# Patient Record
Sex: Female | Born: 1980 | Race: White | Hispanic: No | Marital: Married
Health system: Midwestern US, Community
[De-identification: ages and names within clinical notes are randomized; demographics above are authoritative.]

## PROBLEM LIST (undated history)

## (undated) DIAGNOSIS — Q513 Bicornate uterus: Secondary | ICD-10-CM

## (undated) DIAGNOSIS — IMO0002 Reserved for concepts with insufficient information to code with codable children: Secondary | ICD-10-CM

## (undated) DIAGNOSIS — R87619 Unspecified abnormal cytological findings in specimens from cervix uteri: Secondary | ICD-10-CM

## (undated) DIAGNOSIS — R112 Nausea with vomiting, unspecified: Secondary | ICD-10-CM

## (undated) DIAGNOSIS — Z9889 Other specified postprocedural states: Secondary | ICD-10-CM

## (undated) DIAGNOSIS — K589 Irritable bowel syndrome without diarrhea: Secondary | ICD-10-CM

## (undated) HISTORY — PX: OTHER SURGICAL HISTORY: SHX169

## (undated) HISTORY — DX: Irritable bowel syndrome, unspecified: K58.9

---

## 2004-11-30 ENCOUNTER — Other Ambulatory Visit: Admission: RE | Admit: 2004-11-30 | Discharge: 2004-11-30 | Payer: Self-pay | Admitting: Gynecology

## 2005-12-04 ENCOUNTER — Other Ambulatory Visit: Admission: RE | Admit: 2005-12-04 | Discharge: 2005-12-04 | Payer: Self-pay | Admitting: Gynecology

## 2006-12-12 ENCOUNTER — Encounter: Admission: RE | Admit: 2006-12-12 | Discharge: 2006-12-12 | Payer: Self-pay | Admitting: *Deleted

## 2007-01-21 ENCOUNTER — Other Ambulatory Visit: Admission: RE | Admit: 2007-01-21 | Discharge: 2007-01-21 | Payer: Self-pay | Admitting: Gynecology

## 2007-07-15 HISTORY — PX: GYNECOLOGIC CRYOSURGERY: SHX857

## 2007-11-25 ENCOUNTER — Other Ambulatory Visit: Admission: RE | Admit: 2007-11-25 | Discharge: 2007-11-25 | Payer: Self-pay | Admitting: Gynecology

## 2008-06-24 ENCOUNTER — Encounter: Payer: Self-pay | Admitting: Women's Health

## 2008-06-24 ENCOUNTER — Ambulatory Visit: Payer: Self-pay | Admitting: Women's Health

## 2008-06-24 ENCOUNTER — Other Ambulatory Visit: Admission: RE | Admit: 2008-06-24 | Discharge: 2008-06-24 | Payer: Self-pay | Admitting: Gynecology

## 2008-12-28 ENCOUNTER — Ambulatory Visit: Payer: Self-pay | Admitting: Women's Health

## 2008-12-28 ENCOUNTER — Encounter: Payer: Self-pay | Admitting: Women's Health

## 2008-12-28 ENCOUNTER — Other Ambulatory Visit: Admission: RE | Admit: 2008-12-28 | Discharge: 2008-12-28 | Payer: Self-pay | Admitting: Gynecology

## 2009-02-06 ENCOUNTER — Emergency Department (HOSPITAL_COMMUNITY): Admission: EM | Admit: 2009-02-06 | Discharge: 2009-02-06 | Payer: Self-pay | Admitting: Emergency Medicine

## 2009-02-07 ENCOUNTER — Encounter: Admission: RE | Admit: 2009-02-07 | Discharge: 2009-02-07 | Payer: Self-pay | Admitting: Emergency Medicine

## 2009-03-08 ENCOUNTER — Encounter: Admission: RE | Admit: 2009-03-08 | Discharge: 2009-03-08 | Payer: Self-pay | Admitting: Family Medicine

## 2009-03-17 ENCOUNTER — Ambulatory Visit: Payer: Self-pay | Admitting: Women's Health

## 2009-04-05 ENCOUNTER — Encounter: Admission: RE | Admit: 2009-04-05 | Discharge: 2009-04-05 | Payer: Self-pay | Admitting: Family Medicine

## 2009-04-19 ENCOUNTER — Ambulatory Visit: Payer: Self-pay | Admitting: Women's Health

## 2009-05-16 ENCOUNTER — Ambulatory Visit: Payer: Self-pay | Admitting: Gynecology

## 2009-07-20 ENCOUNTER — Ambulatory Visit: Payer: Self-pay | Admitting: Women's Health

## 2009-07-20 ENCOUNTER — Encounter: Payer: Self-pay | Admitting: Women's Health

## 2009-07-20 ENCOUNTER — Other Ambulatory Visit: Admission: RE | Admit: 2009-07-20 | Discharge: 2009-07-20 | Payer: Self-pay | Admitting: Gynecology

## 2010-03-19 IMAGING — CT CT ABDOMEN W/O CM
2 of 4 series · 14 of 32 positions shown, 19 images · non-contrast
Comparison: None available.

CT ABDOMEN

CLINICAL DATA: Right flank pain and hematuria.

CT ABDOMEN AND PELVIS WITHOUT CONTRAST
TECHNIQUE: Multidetector CT imaging of the abdomen and pelvis was
performed following the standard protocol without intravenous
contrast.

[Series 2: renal stone w/o · axial · non-contrast · 0.57mm/px · z∈[-303,-23]mm · 7 of 76 slices shown, 12 images]
[im 10/76  soft-tissue]
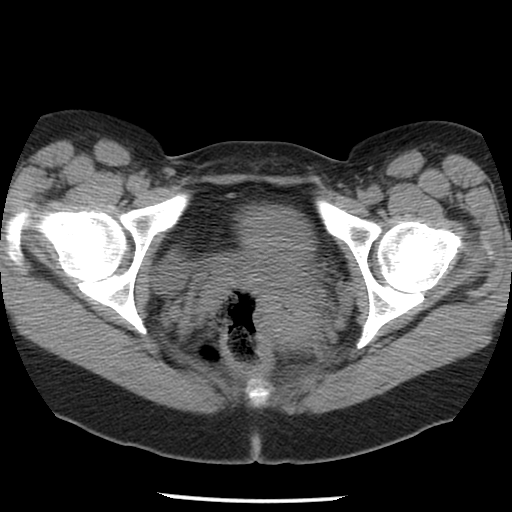
[im 10/76  bone]
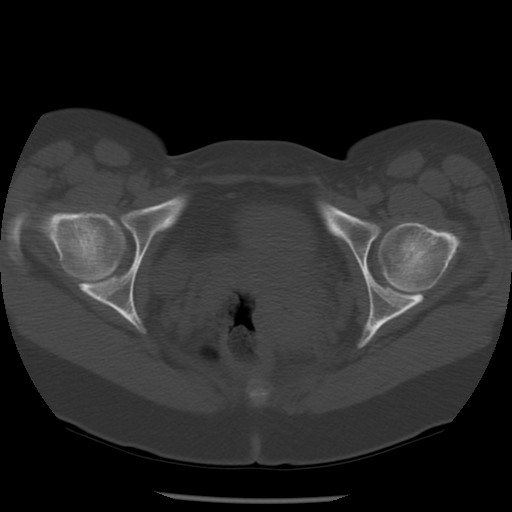
[im 19/76  soft-tissue]
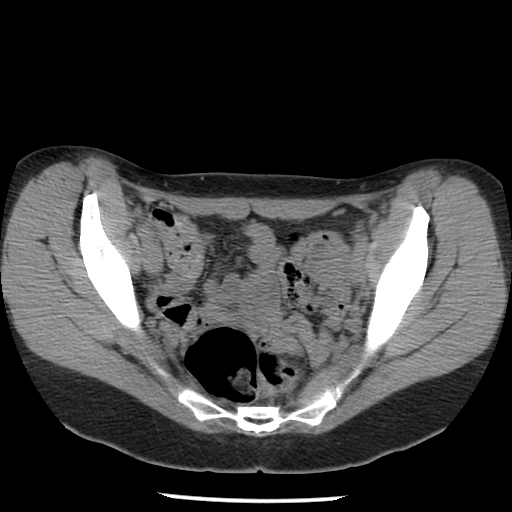
[im 29/76  soft-tissue]
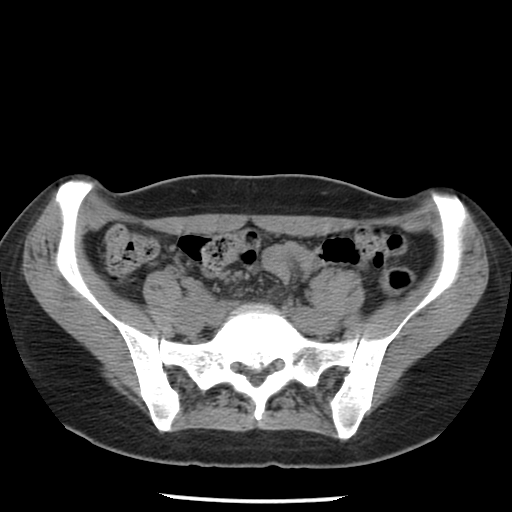
[im 38/76  soft-tissue]
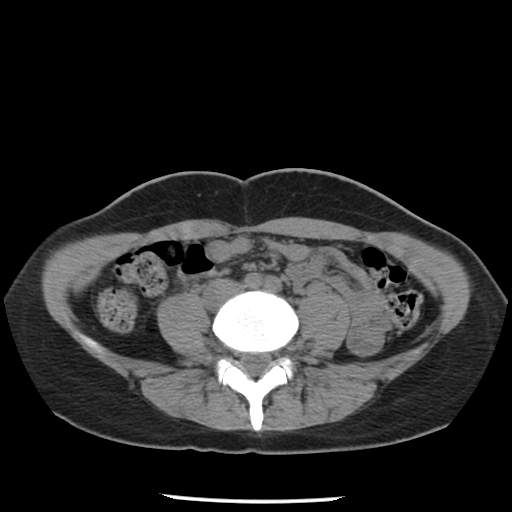
[im 38/76  lung]
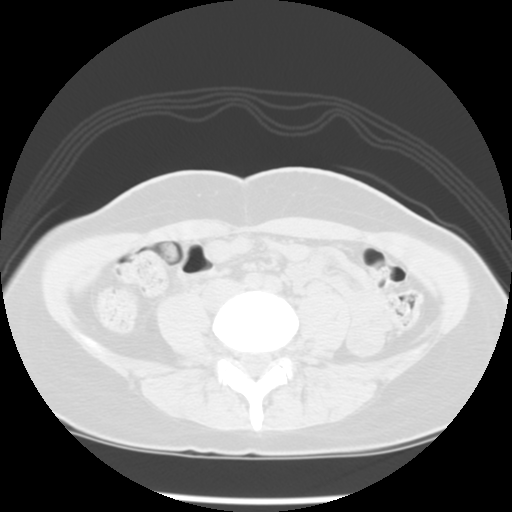
[im 47/76  soft-tissue]
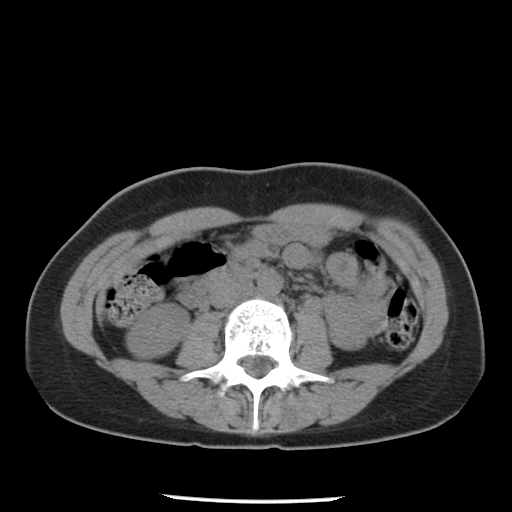
[im 47/76  lung]
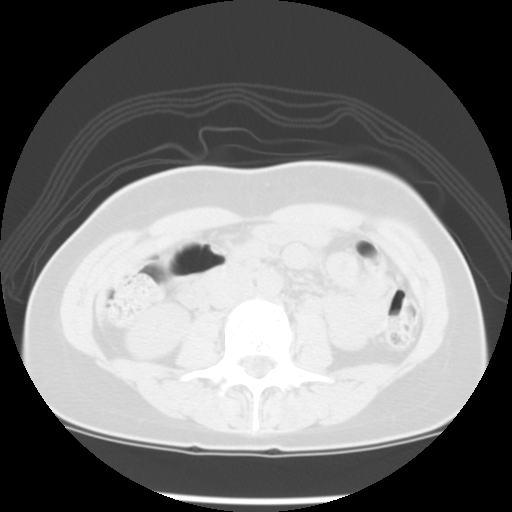
[im 57/76  soft-tissue]
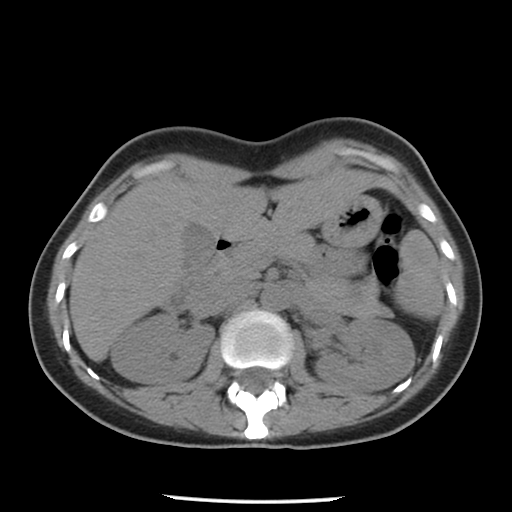
[im 57/76  lung]
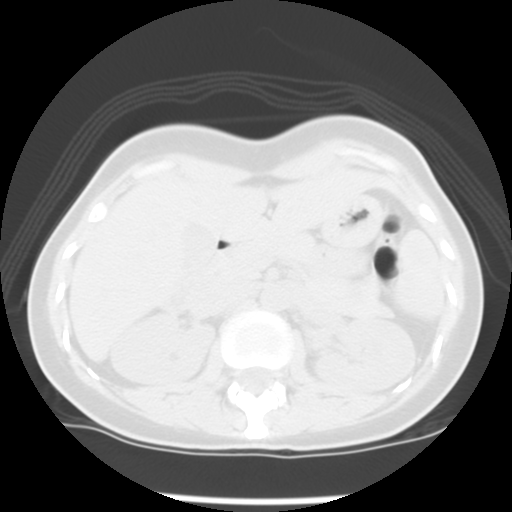
[im 66/76  soft-tissue]
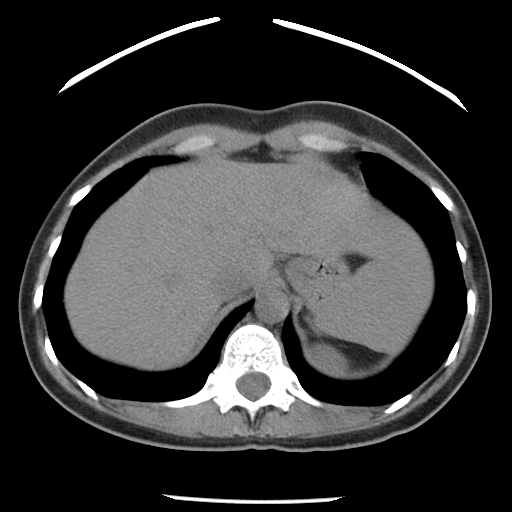
[im 66/76  lung]
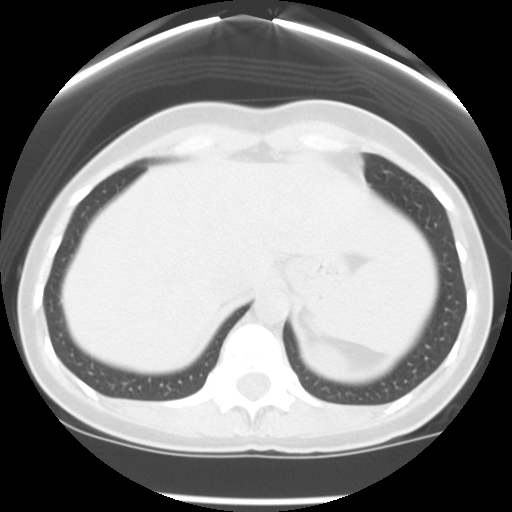

[Series 401: sagittal · sagittal · 0.80mm/px · 7 of 115 slices shown]
[im 11/115  soft-tissue]
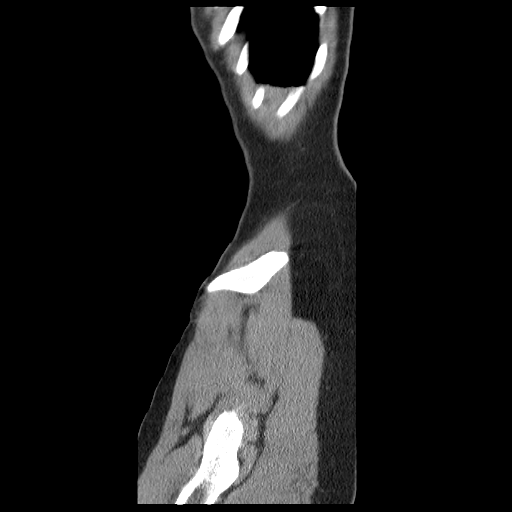
[im 21/115  soft-tissue]
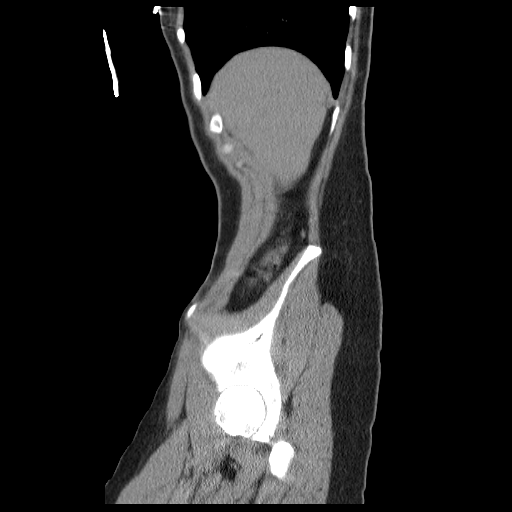
[im 42/115  soft-tissue]
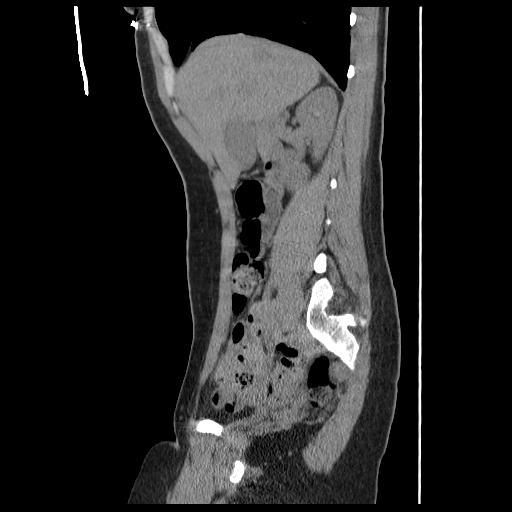
[im 52/115  soft-tissue]
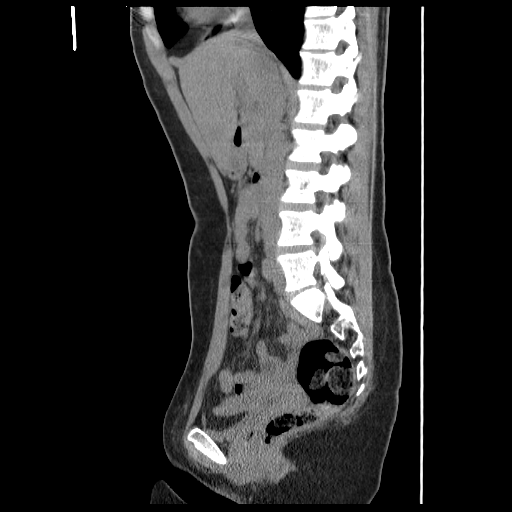
[im 63/115  soft-tissue]
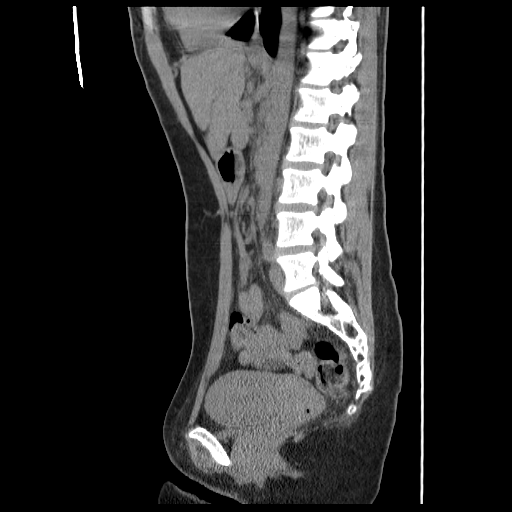
[im 73/115  soft-tissue]
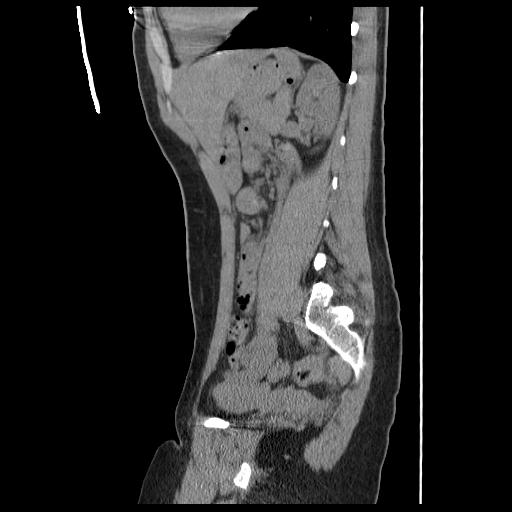
[im 94/115  soft-tissue]
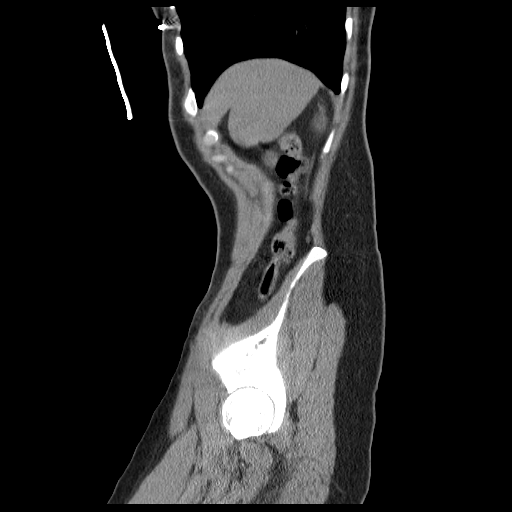

[14 of 32 positions shown; findings below may reference images not displayed]

FINDINGS: The lung bases are clear without focal nodule, mass, or
airspace disease.  The heart size is normal.  There is no
significant pleural or pericardial effusion.  The uninfused
appearance of the liver and spleen is normal.  The stomach,
pancreas and gallbladder are normal.  The adrenal glands are within
normal limits bilaterally.  There is no evidence for
nephrolithiasis.  There is no hydronephrosis.  No stones are seen
along the course of the ureter on either side.  The ureters are of
normal size.  There is no significant abdominal lymphadenopathy or
free fluid.  The bowel is unremarkable.  Bone windows are
unremarkable.
IMPRESSION: 1.  No evidence for nephrolithiasis or hydronephrosis.
2.  Normal appearance of the ureters bilaterally.
3.  No acute or focal abnormality the abdomen.

CT PELVIS
FINDINGS: The rectosigmoid colon is within normal limits.  The
remainder the colon is unremarkable.  The appendix is not clearly
visualized.  No secondary inflammatory changes are evident.  The
uterus and adnexa are within normal limits.  There is no
significant pelvic adenopathy or free fluid.  The urinary bladder
is collapsed.  Bone windows are unremarkable.
IMPRESSION: 1.  No acute or focal abnormality of the pelvis.

## 2010-07-26 ENCOUNTER — Other Ambulatory Visit: Admission: RE | Admit: 2010-07-26 | Discharge: 2010-07-26 | Payer: Self-pay | Admitting: Gynecology

## 2010-07-26 ENCOUNTER — Ambulatory Visit: Payer: Self-pay | Admitting: Women's Health

## 2010-11-15 ENCOUNTER — Ambulatory Visit (INDEPENDENT_AMBULATORY_CARE_PROVIDER_SITE_OTHER): Payer: BC Managed Care – PPO | Admitting: Women's Health

## 2010-11-15 DIAGNOSIS — B373 Candidiasis of vulva and vagina: Secondary | ICD-10-CM

## 2010-11-15 DIAGNOSIS — N898 Other specified noninflammatory disorders of vagina: Secondary | ICD-10-CM

## 2011-06-05 ENCOUNTER — Other Ambulatory Visit: Payer: Self-pay

## 2011-06-05 ENCOUNTER — Ambulatory Visit (INDEPENDENT_AMBULATORY_CARE_PROVIDER_SITE_OTHER): Payer: BC Managed Care – PPO | Admitting: Women's Health

## 2011-06-05 ENCOUNTER — Encounter: Payer: Self-pay | Admitting: Women's Health

## 2011-06-05 VITALS — BP 130/70

## 2011-06-05 DIAGNOSIS — N912 Amenorrhea, unspecified: Secondary | ICD-10-CM

## 2011-06-05 DIAGNOSIS — O9989 Other specified diseases and conditions complicating pregnancy, childbirth and the puerperium: Secondary | ICD-10-CM

## 2011-06-05 DIAGNOSIS — N926 Irregular menstruation, unspecified: Secondary | ICD-10-CM

## 2011-06-05 LAB — POCT URINE PREGNANCY: Preg Test, Ur: POSITIVE

## 2011-06-05 NOTE — Progress Notes (Signed)
  Presents with a home U PT positive, U PT here at the office is positive as well. She has been using withdrawal for contraception for greater than 6 years without a problem or pregnancy. She is upset over the pregnancy since she has 3 children at home, but is not sure what the best choice is. States she does not feel she could terminat if there is a heartbeat. Ultrasound today does show a positive IUP,  heartbeat has not started. She is going to think about whether she wants to terminate or proceed with the pregnancy. Copy of the ultrasound was given. If she so chooses to continue with the pregnancy she is aware we no longer deliver. Will return in 2 weeks for a viability ultrasound. Will continue taking a multivitamin daily.

## 2011-06-06 DIAGNOSIS — K589 Irritable bowel syndrome without diarrhea: Secondary | ICD-10-CM | POA: Insufficient documentation

## 2011-06-06 DIAGNOSIS — G43909 Migraine, unspecified, not intractable, without status migrainosus: Secondary | ICD-10-CM | POA: Insufficient documentation

## 2011-07-04 NOTE — Progress Notes (Signed)
  PT. HAS AN APPT. WITH DR. Enrique Sack ROSS AT [facility] 07-18-11. SHE STATES SHE ALREADY HAS A COPY OF HER RECENT ULTRASOUND AND BLOODWORK  CONFIRMING PRENANCY. I FAXED HER OV NOTE TO DR. ROSS @ [phone] & ADDED ON FAX COVER SHEET PT WILL BRING HER [place] AND LAB REPORT.

## 2011-07-08 ENCOUNTER — Encounter: Payer: Self-pay | Admitting: Women's Health

## 2011-07-08 ENCOUNTER — Ambulatory Visit (INDEPENDENT_AMBULATORY_CARE_PROVIDER_SITE_OTHER): Payer: BC Managed Care – PPO | Admitting: Women's Health

## 2011-07-08 VITALS — BP 120/70

## 2011-07-08 DIAGNOSIS — R823 Hemoglobinuria: Secondary | ICD-10-CM

## 2011-07-08 NOTE — Progress Notes (Signed)
  Approximately [redacted] weeks pregnant, she does have a scheduled appointment with Dr. Tenny Craw next week to start prenatal care. She states she has some low groin discomfort on both right and left sides and some low back pain, she does work at [employer] and requested a note to miss work today. Denies any pain burning or changes with urination, denies vag bleeding, a fever, no N/V/D.  Exam: No CVAT, abdomen soft. External genitalia is within normal limits, speculum exam cervix is pink healthy no noted discharge or erythema. Bimanual uterus palpates about 8 weeks size, nontender. UA O-2 WBCs, +1  bacteria will check a urine culture.  Probable round ligament stretching.  Note to miss work today, rest today, keep scheduled OB appointment continue prenatal vitamins daily. Reviewed will call if urine culture positive.

## 2011-07-09 ENCOUNTER — Telehealth: Payer: Self-pay | Admitting: *Deleted

## 2011-07-09 NOTE — Telephone Encounter (Signed)
Patient called about results from urine culture.  Culture was neg. Also c/o possible yeast.  Has Diflucan on hand will check with pharmacy and then f/u with Dr. Tenny Craw Ob/Gyn next week.

## 2011-07-12 ENCOUNTER — Ambulatory Visit (INDEPENDENT_AMBULATORY_CARE_PROVIDER_SITE_OTHER): Payer: BC Managed Care – PPO | Admitting: Women's Health

## 2011-07-12 ENCOUNTER — Encounter: Payer: Self-pay | Admitting: Women's Health

## 2011-07-12 VITALS — BP 110/70

## 2011-07-12 DIAGNOSIS — B373 Candidiasis of vulva and vagina: Secondary | ICD-10-CM

## 2011-07-12 DIAGNOSIS — N898 Other specified noninflammatory disorders of vagina: Secondary | ICD-10-CM

## 2011-07-12 DIAGNOSIS — L293 Anogenital pruritus, unspecified: Secondary | ICD-10-CM

## 2011-07-12 NOTE — Progress Notes (Signed)
  Approximately [redacted] weeks pregnant, start prenatal care at [facility] next week. States had a yeast infection with curdy white discharge used over-the-counter Monistat that caused extreme burning. States has vaginal itching mostly external. Denies any bleeding or urinary symptoms.  Exam: External genitalia is erythematous, speculum exam scant white discharge vaginal walls are slightly erythematous. Wet prep positive for yeast. Bimanual uterus about 8 weeks size nontender.  Early pregnant, with yeast  Plan: Keep open to air, loose clothes, keep scheduled appointment next week, nystatin cream to external genitalia. Avoid Monistat.

## 2011-07-19 ENCOUNTER — Other Ambulatory Visit: Payer: Self-pay | Admitting: Obstetrics and Gynecology

## 2011-07-19 LAB — ANTIBODY SCREEN: Antibody Screen: NEGATIVE

## 2011-07-19 LAB — ABO/RH: RH Type: POSITIVE

## 2011-07-19 LAB — HIV ANTIBODY (ROUTINE TESTING W REFLEX): HIV: NONREACTIVE

## 2011-07-19 LAB — GC/CHLAMYDIA PROBE AMP, GENITAL: Gonorrhea: NEGATIVE

## 2011-10-15 NOTE — L&D Delivery Note (Signed)
Patient was C/C/=2 and pushed for 10 minutes with epidural.   NSVD  female infant, Apgars 7,8, weight 6#11.   The patient had an MLE lacerations repaired with 2-0 vicryl R. Fundus was firm. EBL was expected. Placenta was delivered intact. Vagina was clear.  Baby was vigorous to bedside.  Analysse Quinonez A

## 2012-01-08 LAB — STREP B DNA PROBE: GBS: NEGATIVE

## 2012-01-30 ENCOUNTER — Encounter (HOSPITAL_COMMUNITY): Payer: Self-pay | Admitting: *Deleted

## 2012-01-30 ENCOUNTER — Inpatient Hospital Stay (HOSPITAL_COMMUNITY)
Admission: AD | Admit: 2012-01-30 | Discharge: 2012-02-01 | DRG: 775 | Disposition: A | Discharge status: Home / Self Care | Payer: Medicaid Other | Source: Ambulatory Visit | Attending: Obstetrics and Gynecology | Admitting: Obstetrics and Gynecology

## 2012-01-30 HISTORY — DX: Reserved for concepts with insufficient information to code with codable children: IMO0002

## 2012-01-30 HISTORY — DX: Nausea with vomiting, unspecified: R11.2

## 2012-01-30 HISTORY — DX: Unspecified abnormal cytological findings in specimens from cervix uteri: R87.619

## 2012-01-30 HISTORY — DX: Other specified postprocedural states: Z98.890

## 2012-01-30 LAB — CBC
HCT: 37.5 % (ref 36.0–46.0)
HCT: 36.2 % (ref 36.0–46.0)
Hemoglobin: 12.5 g/dL (ref 12.0–15.0)
Hemoglobin: 12.1 g/dL (ref 12.0–15.0)
MCHC: 33.4 g/dL (ref 30.0–36.0)
RBC: 4.2 MIL/uL (ref 3.87–5.11)
RBC: 4.05 MIL/uL (ref 3.87–5.11)

## 2012-01-30 LAB — RPR: RPR Ser Ql: NONREACTIVE

## 2012-01-30 LAB — SYPHILIS: RPR W/REFLEX TO RPR TITER AND TREPONEMAL ANTIBODIES, TRADITIONAL SCREENING AND DIAGNOSIS ALGORITHM: RPR Ser Ql: NONREACTIVE

## 2012-01-30 LAB — ABO/RH: ABO/RH(D): O POS

## 2012-01-30 MED ORDER — TETANUS-DIPHTH-ACELL PERTUSSIS 5-2.5-18.5 LF-MCG/0.5 IM SUSP: 0.5000 mL | Freq: Once | INTRAMUSCULAR | Status: DC

## 2012-01-30 MED ORDER — LACTATED RINGERS IV SOLN: INTRAVENOUS | Status: DC

## 2012-01-30 MED ORDER — MAGNESIUM HYDROXIDE 400 MG/5ML PO SUSP: 30.0000 mL | ORAL | Status: DC | PRN

## 2012-01-30 MED ORDER — CITRIC ACID-SODIUM CITRATE 334-500 MG/5ML PO SOLN: 30.0000 mL | ORAL | Status: DC | PRN

## 2012-01-30 MED ORDER — FLEET ENEMA 7-19 GM/118ML RE ENEM: 1.0000 | ENEMA | RECTAL | Status: DC | PRN

## 2012-01-30 MED ORDER — OXYTOCIN 20 UNITS IN LACTATED RINGERS INFUSION - SIMPLE: 125.0000 mL/h | INTRAVENOUS | Status: DC | PRN

## 2012-01-30 MED ORDER — FERROUS SULFATE 325 (65 FE) MG PO TABS
325.0000 mg | ORAL_TABLET | Freq: Two times a day (BID) | ORAL | Status: DC
  Administered 2012-01-30 – 2012-02-01 (×4): 325 mg via ORAL
  Filled 2012-01-30 (×4): qty 1

## 2012-01-30 MED ORDER — OXYTOCIN BOLUS FROM INFUSION
500.0000 mL | Freq: Once | INTRAVENOUS | Status: DC
  Filled 2012-01-30: qty 500

## 2012-01-30 MED ORDER — LIDOCAINE HCL (PF) 1 % IJ SOLN
30.0000 mL | INTRAMUSCULAR | Status: DC | PRN
  Administered 2012-01-30: 30 mL via SUBCUTANEOUS
  Filled 2012-01-30: qty 30

## 2012-01-30 MED ORDER — NALBUPHINE SYRINGE 5 MG/0.5 ML
5.0000 mg | INJECTION | INTRAMUSCULAR | Status: DC | PRN
  Filled 2012-01-30: qty 0.5

## 2012-01-30 MED ORDER — LACTATED RINGERS IV SOLN: 500.0000 mL | INTRAVENOUS | Status: DC | PRN

## 2012-01-30 MED ORDER — SENNOSIDES-DOCUSATE SODIUM 8.6-50 MG PO TABS
2.0000 | ORAL_TABLET | Freq: Every day | ORAL | Status: DC
  Administered 2012-01-30 – 2012-01-31 (×2): 2 via ORAL

## 2012-01-30 MED ORDER — BENZOCAINE-MENTHOL 20-0.5 % EX AERO: 1.0000 "application " | INHALATION_SPRAY | CUTANEOUS | Status: DC | PRN

## 2012-01-30 MED ORDER — IBUPROFEN 800 MG PO TABS
800.0000 mg | ORAL_TABLET | Freq: Three times a day (TID) | ORAL | Status: DC
  Administered 2012-01-30 – 2012-02-01 (×6): 800 mg via ORAL
  Filled 2012-01-30 (×5): qty 1
  Filled 2012-01-30: qty 2

## 2012-01-30 MED ORDER — SODIUM CHLORIDE 0.9 % IJ SOLN
3.0000 mL | Freq: Two times a day (BID) | INTRAMUSCULAR | Status: DC
Start: 2012-01-30 — End: 2012-02-01

## 2012-01-30 MED ORDER — ONDANSETRON HCL 4 MG/2ML IJ SOLN: 4.0000 mg | Freq: Four times a day (QID) | INTRAMUSCULAR | Status: DC | PRN

## 2012-01-30 MED ORDER — OXYTOCIN BOLUS FROM INFUSION
500.0000 mL | Freq: Once | INTRAVENOUS | Status: DC
  Filled 2012-01-30: qty 500
  Filled 2012-01-30: qty 1000

## 2012-01-30 MED ORDER — IBUPROFEN 600 MG PO TABS
600.0000 mg | ORAL_TABLET | Freq: Four times a day (QID) | ORAL | Status: DC | PRN
  Administered 2012-01-30: 600 mg via ORAL
  Filled 2012-01-30: qty 1

## 2012-01-30 MED ORDER — PRENATAL MULTIVITAMIN CH
1.0000 | ORAL_TABLET | Freq: Every day | ORAL | Status: DC
  Administered 2012-01-31 – 2012-02-01 (×2): 1 via ORAL
  Filled 2012-01-30 (×2): qty 1

## 2012-01-30 MED ORDER — SODIUM CHLORIDE 0.9 % IJ SOLN: 3.0000 mL | INTRAMUSCULAR | Status: DC | PRN

## 2012-01-30 MED ORDER — LANOLIN HYDROUS EX OINT: TOPICAL_OINTMENT | CUTANEOUS | Status: DC | PRN

## 2012-01-30 MED ORDER — SIMETHICONE 80 MG PO CHEW: 80.0000 mg | CHEWABLE_TABLET | ORAL | Status: DC | PRN

## 2012-01-30 MED ORDER — METHYLERGONOVINE MALEATE 0.2 MG/ML IJ SOLN: 0.2000 mg | INTRAMUSCULAR | Status: DC | PRN

## 2012-01-30 MED ORDER — OXYTOCIN 20 UNITS IN LACTATED RINGERS INFUSION - SIMPLE: 125.0000 mL/h | Freq: Once | INTRAVENOUS | Status: DC

## 2012-01-30 MED ORDER — ACETAMINOPHEN 325 MG PO TABS: 650.0000 mg | ORAL_TABLET | ORAL | Status: DC | PRN

## 2012-01-30 MED ORDER — DIPHENHYDRAMINE HCL 25 MG PO CAPS: 25.0000 mg | ORAL_CAPSULE | Freq: Four times a day (QID) | ORAL | Status: DC | PRN

## 2012-01-30 MED ORDER — OXYCODONE-ACETAMINOPHEN 5-325 MG PO TABS: 1.0000 | ORAL_TABLET | ORAL | Status: DC | PRN

## 2012-01-30 MED ORDER — WITCH HAZEL-GLYCERIN EX PADS: 1.0000 "application " | MEDICATED_PAD | CUTANEOUS | Status: DC | PRN

## 2012-01-30 MED ORDER — DIBUCAINE 1 % RE OINT
1.0000 | TOPICAL_OINTMENT | RECTAL | Status: DC | PRN
Start: 2012-01-30 — End: 2012-02-01

## 2012-01-30 MED ORDER — LIDOCAINE HCL (PF) 1 % IJ SOLN
30.0000 mL | INTRAMUSCULAR | Status: DC | PRN
  Filled 2012-01-30: qty 30

## 2012-01-30 MED ORDER — OXYTOCIN 20 UNITS IN LACTATED RINGERS INFUSION - SIMPLE
125.0000 mL/h | Freq: Once | INTRAVENOUS | Status: AC
  Administered 2012-01-30: 125 mL/h via INTRAVENOUS

## 2012-01-30 MED ORDER — IBUPROFEN 600 MG PO TABS: 600.0000 mg | ORAL_TABLET | Freq: Four times a day (QID) | ORAL | Status: DC | PRN

## 2012-01-30 MED ORDER — SODIUM CHLORIDE 0.9 % IV SOLN: 250.0000 mL | INTRAVENOUS | Status: DC | PRN

## 2012-01-30 MED ORDER — METHYLERGONOVINE MALEATE 0.2 MG PO TABS: 0.2000 mg | ORAL_TABLET | ORAL | Status: DC | PRN

## 2012-01-30 MED ORDER — ZOLPIDEM TARTRATE 5 MG PO TABS: 5.0000 mg | ORAL_TABLET | Freq: Every evening | ORAL | Status: DC | PRN

## 2012-01-30 MED ORDER — MEASLES, MUMPS & RUBELLA VAC ~~LOC~~ INJ
0.5000 mL | INJECTION | Freq: Once | SUBCUTANEOUS | Status: DC
  Filled 2012-01-30: qty 0.5

## 2012-01-30 MED ORDER — LACTATED RINGERS IV SOLN
INTRAVENOUS | Status: DC
  Administered 2012-01-30: 10:00:00 via INTRAVENOUS

## 2012-01-30 MED ORDER — ONDANSETRON HCL 4 MG PO TABS: 4.0000 mg | ORAL_TABLET | ORAL | Status: DC | PRN

## 2012-01-30 MED ORDER — ONDANSETRON HCL 4 MG/2ML IJ SOLN: 4.0000 mg | INTRAMUSCULAR | Status: DC | PRN

## 2012-01-30 NOTE — MAU Note (Signed)
Started last night, ctx's irregular.  3rd baby, was starting to thin when last checked.  Present baby and last- measured small, no other problems.

## 2012-01-30 NOTE — Progress Notes (Signed)
Update- According to patient, she had f/u u/s for growth two weeks ago.  EFW was 5 #12, 23%ile.  NST R; now 7/C/+1  Ashe Gago A

## 2012-01-30 NOTE — H&P (Signed)
31 y.o. [redacted]w[redacted]d  G3P2002 comes in c/o contractions; no LOF.  Otherwise has good fetal movement and no bleeding.  Past Medical History  Diagnosis Date  . Migraine   . IBS (irritable colon syndrome)   . PONV (postoperative nausea and vomiting)     after ear tubes  . Abnormal Pap smear     cryo, no problems since    Past Surgical History  Procedure Date  . Tubes in ears     X2 IN CHILDHOOD  . 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 Marital Status: Married    Spouse Name: N/A    Number of Children: N/A  . Years of Education: N/A   Occupational History  . Not on file.   Social History Main Topics  . Smoking status: Never Smoker   . Smokeless tobacco: Never Used  . Alcohol Use: No  . Drug Use: No  . Sexually Active: Yes -- Female partner(s)   Other Topics Concern  . Not on file   Social History Narrative  . No narrative on file   Ceclor; Ciprofloxacin; and Latex   Prenatal Course:  Uncomplicated.  Pt has a history of IUGR in second pregnancy (baby weighed 6# at 40 weeks.)  Last U/S in this pregnancy was at 26 weeks and EFW was 36 %ile.  Filed Vitals:   01/30/12 0907  BP: 113/70  Pulse:   Temp:   Resp:      Lungs/Cor:  NAD Abdomen:  soft, gravid Ex:  no cords, erythema SVE:  4/90/-2 FHTs:  !30s, good STV, NST R Toco:  q5-10   A/P   Labor at term.  GBS neg.  Liem Copenhaver A

## 2012-01-31 LAB — CBC
HCT: 37.8 % (ref 36.0–46.0)
Hemoglobin: 12.6 g/dL (ref 12.0–15.0)
MCHC: 33.3 g/dL (ref 30.0–36.0)
RBC: 4.19 MIL/uL (ref 3.87–5.11)
WBC: 12.8 10*3/uL — ABNORMAL HIGH (ref 4.0–10.5)

## 2012-01-31 NOTE — Progress Notes (Signed)
Patient is eating, ambulating, voiding.  Pain control is good. Appropriate lochia. No other complaints.  Filed Vitals:   01/30/12 1450 01/30/12 1600 01/30/12 1948 01/31/12 0400  BP: 103/63 96/56 105/62 93/56  Pulse: 62 73 64 61  Temp: 98.3 F (36.8 C) 98.4 F (36.9 C) 98.6 F (37 C) 98.2 F (36.8 C)  TempSrc: Oral Axillary Oral Oral  Resp: 18 20 18 20   Height:      Weight:      SpO2:  96% 96% 97%    Fundus firm Ext: no CT  Lab Results  Component Value Date   WBC 12.8* 01/31/2012   HGB 12.6 01/31/2012   HCT 37.8 01/31/2012   MCV 90.2 01/31/2012   PLT 202 01/31/2012    --/--/O POS (04/18 1140)/RI  A/P Post partum day 1  Routine care.  Expect d/c poss. today  Philip Aspen

## 2012-01-31 NOTE — Progress Notes (Signed)
Spoke to [employer] and passed on order for [place] harness per Dr. Tama High.

## 2012-01-31 NOTE — Progress Notes (Signed)
UR chart review completed.  

## 2012-01-31 NOTE — Discharge Summary (Signed)
Obstetric Discharge Summary Reason for Admission: onset of labor Prenatal Procedures: none Intrapartum Procedures: spontaneous vaginal delivery Postpartum Procedures: none Complications-Operative and Postpartum: none Hemoglobin  Date Value Range Status  01/31/2012 12.6  12.0-15.0 (g/dL) Final     HCT  Date Value Range Status  01/31/2012 37.8  36.0-46.0 (%) Final    Physical Exam:  General: alert and cooperative Lochia: appropriate Uterine Fundus: firm  DVT Evaluation: No evidence of DVT seen on physical exam.  Discharge Diagnoses: Term Pregnancy-delivered  Discharge Information: Date: 01/31/2012 Activity: pelvic rest Diet: routine Medications: PNV and Ibuprofen Condition: stable Instructions: refer to practice specific booklet Discharge to: home Follow-up Information    Follow up with HORVATH,MICHELLE A, MD in 4 weeks.   Contact information:   [address] [place] [address] [phone]          Newborn Data: Live born female  Birth Weight: 6 lb 11.4 oz (3045 g) APGAR: 7, 8  Home with mother.  Mary Riddle 01/31/2012, 10:29 AM

## 2013-02-03 ENCOUNTER — Other Ambulatory Visit: Payer: Self-pay | Admitting: Obstetrics and Gynecology

## 2014-08-15 ENCOUNTER — Encounter (HOSPITAL_COMMUNITY): Payer: Self-pay | Admitting: *Deleted

## 2015-09-27 ENCOUNTER — Telehealth: Payer: Self-pay | Admitting: *Deleted

## 2015-09-27 NOTE — Telephone Encounter (Signed)
Pt called asking for medication allergies for UTI I explained to patient that cipro in our system and we don't have access to medication that was prescribed in past as paper charts are at a office site location. Pt is going to check with current OB to see if they have any records of the above.

## 2015-11-09 ENCOUNTER — Encounter

## 2015-11-15 ENCOUNTER — Inpatient Hospital Stay: Admit: 2015-11-15 | Payer: Self-pay | Attending: Obstetrics & Gynecology | Primary: Adult Health

## 2015-11-15 DIAGNOSIS — N281 Cyst of kidney, acquired: Secondary | ICD-10-CM

## 2015-12-13 ENCOUNTER — Inpatient Hospital Stay: Admit: 2015-12-13 | Payer: Self-pay | Primary: Adult Health

## 2015-12-13 DIAGNOSIS — R102 Pelvic and perineal pain: Secondary | ICD-10-CM

## 2015-12-13 NOTE — Progress Notes (Signed)
PELVIC FLOOR DAILY TREATMENT NOTE 8-14    Patient Name: Jackie Hendricks  Date:12/13/2015  DOB: 06/02/1981    Patient DOB Verified  Payor: SELF PAY / Plan: [facility] SELF PAY / Product Type: Self Pay /    In time:12:39  Out time:1:17  Total Treatment Time (min): 38    Visit #: 1 of 8    Treatment Area: Pelvic and perineal pain [R10.2]    SUBJECTIVE  Pain Level (0-10 scale): 0  Any medication changes, allergies to medications, adverse drug reactions, diagnosis change, or new procedure performed?:  No     Yes (see summary sheet for update)  Subjective functional status/changes:    No changes reported  See medical history    OBJECTIVE           10 min Patient Education:  Review HEP     Progressed/Changed HEP based on: Educated Pt in pelvic floor anatomy, function/dysfunction, cystocele.     positioning    body mechanics    transfers    heat/ice application          Pain Level (0-10 scale) post treatment: 0    ASSESSMENT/Changes in Function: Justification for Eval Code Complexity:  Patient History : G3, P3 vaginal  Examination ROM, MMT, SI tests, internal eval  Clinical Presentation: evolving  Clinical Decision Making : PFDI pain 21      Patient will continue to benefit from skilled PT services to modify and progress therapeutic interventions, address strength deficits, analyze and address soft tissue restrictions, instruct in home and community integration and address SUI, increased urinary frequency and nocturia to attain remaining goals.       See Plan of Care    See progress note/recertification    See Discharge Summary         Progress towards goals / Updated goals:  Eval and education initiated    PLAN    Upgrade activities as tolerated       Continue plan of care    Update interventions per flow sheet         Discharge due to:_    Other:_      Glenna Fellows, PT 12/13/2015  12:39 PM

## 2015-12-13 NOTE — Progress Notes (Signed)
[facility] ??? [facility]  [address], [address], [place], [place] [address] - Phone: [phone]  Fax: [phone]  PLAN OF CARE / STATEMENT OF MEDICAL NECESSITY FOR PHYSICAL THERAPY SERVICES  Patient Name: Jackie Hendricks DOB: [birth year]   Medical   Diagnosis: Pelvic and perineal pain [R10.2] Treatment Diagnosis: Pelvic and perineal pain [R10.2]   Onset Date: 07/2015     Referral Source: Salvatore Marvel, MD Start of Care [facility]): 12/13/2015   Prior Hospitalization: See medical history Provider #: [phone]   Prior Level of Function: Chronic urinary increased frequency without pain   Comorbidities: G3, P3   Medications: Verified on Patient Summary List   The Plan of Care and following information is based on the information from the initial evaluation.   ==================================================================================  Assessment / key information:  Patient is a 35 y.o. yo female who presents to [facility] with diagnosis of Pelvic and perineal pain [R10.2].  Patient reports lower abdominal pain and pressure which began 07/2015.  She has urinary incontinence with jumping, sneezing, coughing and laughing, increased urinary frequency at 13+ times daily and nocturia 3x nightly.  She denies dyspareunia.  Patient is G3, P3 with vaginal deliveries. Upon objective evaluation, patient demonstrates R SI hypomobility with anterior ileal rotation and inflare.  There was no tenderness to palpation over pelvic floor muscles but increased tone was noted in levator ani muscles.  Strength of pelvic floor muscles was impaired at 0/5.   There is a grade 2 cystocele present.  Biofeedback was declined to next treatment.  Patient scored 21 on FOTO/PFDI pain indicating decreased quality of life.  Patient can benefit from PT to decrease pain and muscle tone, Increase SI mobility and pelvic floor muscle strength to improve quality of life.   ==================================================================================  Eval Complexity: History: MEDIUM  Complexity : 1-2 comorbidities / personal factors will impact the outcome/ POC Exam:HIGH Complexity : 4+ Standardized tests and measures addressing body structure, function, activity limitation and / or participation in recreation  Presentation: MEDIUM Complexity : Evolving with changing characteristics  Clinical Decision Making:LOW Complexity : FOTO score of 75-100Overall Complexity:LOW   Problem List: Pelvic pain/dysfunction, Decreased pelvic floor mm awareness, Decreased pelvic floor mm strength, Hypertonus of pelvic floor and Other   Treatment Plan may include any combination of the following: Therapeutic exercise, Neuromuscular re-education, Manual therapy, Physical agent/modality and Patient education  Patient / Family readiness to learn indicated by: asking questions, trying to perform skills and interest  Persons(s) to be included in education: patient (P)  Barriers to Learning/Limitations: None  Measures taken:    Patient Goal (s): "I hope to improve my symptoms, strengthen the pelvic floor and learn how to relax pelvic floor muscles"   Patient self reported health status: good  Rehabilitation Potential: good  Short Term Goals: To be accomplished in 4 weeks:     1) Patient performing pelvic floor exercises TID.     2) Patient will report 25% improvement in pain with household chores and execise.     3) Increase PF strength to 1/5 to facilitate continence                  4)Patient using urge suppression techniques to decrease urinary frequency.  Long Term Goals: To be accomplished in 8 weeks:   1) Patient independent in HEP.     2) Patient will report urinary frequency decreased to no > 9x daily  3) Decrease score on FOTO/PFDI pain to 14 to indicate improved function and quality of life.     4)Increase PF strength to 1/5 to facilitate continence.    Frequency / Duration:   Patient to be seen  1  times per week for 8  weeks:  Patient / Caregiver education and instruction: Pain Management, Exercises and Other urge suppression, bladder retraining  G-Codes (GP): na  Therapist Signature: Glenna Fellows, PT Date: 12/13/2015   Certification Period: na Time: 1:24 PM   ==================================================================================  I certify that the above Physical Therapy Services are being furnished while the patient is under my care.  I agree with the treatment plan and certify that this therapy is necessary.    Physician Signature:        Date:       Time:     Please sign and return to [facility] or you may fax the signed copy to [phone]. Thank you.

## 2015-12-27 ENCOUNTER — Encounter: Payer: Self-pay | Primary: Adult Health

## 2016-01-03 ENCOUNTER — Inpatient Hospital Stay: Admit: 2016-01-03 | Payer: Self-pay | Primary: Adult Health

## 2016-01-03 NOTE — Progress Notes (Signed)
PELVIC FLOOR DAILY TREATMENT NOTE 8-14    Patient Name: Jackie Hendricks  Date:01/03/2016  DOB: [birth year]    Patient DOB Verified  Payor: SELF PAY / Plan: [facility] SELF PAY / Product Type: Self Pay /    In time:7:30  Out time:8:02  Total Treatment Time (min): 32    Visit #: 2 of 8    Treatment Area: Pelvic and perineal pain [R10.2]    SUBJECTIVE  Pain Level (0-10 scale): 2  Any medication changes, allergies to medications, adverse drug reactions, diagnosis change, or new procedure performed?:  No     Yes (see summary sheet for update)  Subjective functional status/changes:    No changes reported  Having pain and pressure in anal region    OBJECTIVE  Modality rationale: Neuromuscular reeducation to improve the patient???s urinary incontinence and increased urinary frequency, decrease perineal pain and pressure    Min Type Additional Details   32  Biofeedback x 32 minutes    supine surface     Estim: Att   Unatt        TENS instruct                  IFC  Premod   NMES                     Other:  w/US   w/ice   w/heat  Position:  Location:      Traction:  Cervical       Lumbar                        Prone          Supine                       Intermittent   Continuous Lbs:   before manual   after manual      Ultrasound: Continuous    Pulsed                             Location:  W/cm2:      Iontophoresis with dexamethasone         Location:  Take home patch    In clinic      Ice       heat    Ice massage Position:  Location:      Vasopneumatic Device Pressure:        lo  med  hi   Temperature:  lo  med  hi    Skin assessment post-treatment:  intact redness- no adverse reaction       redness ??? adverse reaction:              min Patient Education:  Review HEP     Progressed/Changed HEP based on:  Begin PF exercises.   positioning    body mechanics    transfers    heat/ice application        Other Objective/Functional Measures:    baseline resting tone: 2.51   slow twitch mms 3.18(.24)   fast twitch mms2.94(.38)     Pain Level (0-10 scale) post treatment: 2    ASSESSMENT/Changes in Function: Severe pelvic floor muscle strength    Patient will continue to benefit from skilled PT services to modify and progress therapeutic interventions, address strength deficits, instruct in home and community integration and address urinary incontinence,  increased frequency of urination and perineal pressure to attain remaining goals.       See Plan of Care    See progress note/recertification    See Discharge Summary         Progress towards goals / Updated goals:  Progressing toward STG #1.    PLAN    Upgrade activities as tolerated       Continue plan of care    Update interventions per flow sheet         Discharge due to:_    Other:_      Glenna FellowsMarlene M Eladia Frame, PT 01/03/2016  7:33 AM

## 2016-01-10 ENCOUNTER — Inpatient Hospital Stay: Admit: 2016-01-10 | Payer: Self-pay | Primary: Adult Health

## 2016-01-10 NOTE — Progress Notes (Signed)
PELVIC FLOOR DAILY TREATMENT NOTE 8-14    Patient Name: Jackie Hendricks  Date:01/10/2016  DOB: 02-21-1981    Patient DOB Verified  Payor: SELF PAY / Plan: [facility] SELF PAY / Product Type: Self Pay /    In time:9:20  Out time:10:06  Total Treatment Time (min): 46    Visit #: 3 of 8    Treatment Area: Pelvic and perineal pain [R10.2]    SUBJECTIVE  Pain Level (0-10 scale): 0  Any medication changes, allergies to medications, adverse drug reactions, diagnosis change, or new procedure performed?:  No     Yes (see summary sheet for update)  Subjective functional status/changes:    No changes reported  Did HEP in lying, sitting and standing.  A little achiness in abdomen after exercising    OBJECTIVE  Modality rationale: Neuromuscular reeducation to improve the patient???s urinary incontinence and increased urinary frequency, decrease perineal pain and pressure    Min Type Additional Details   31  Biofeedback x 36 minutes    supine surface   10  Estim: Att   Unatt        TENS instruct                  IFC  Premod   NMES  5/10 50 Hz Intensity 4                   Other:  w/US   w/ice   w/heat  Position: supine  Location: Perianal      Traction:  Cervical       Lumbar                        Prone          Supine                       Intermittent   Continuous Lbs:   before manual   after manual      Ultrasound: Continuous    Pulsed                           1MHz   3MHz Location:  W/cm2:      Iontophoresis with dexamethasone         Location:  Take home patch    In clinic      Ice       heat    Ice massage Position:  Location:      Vasopneumatic Device Pressure:        lo  med  hi   Temperature:  lo  med  hi    Skin assessment post-treatment:  intact redness- no adverse reaction       redness ??? adverse reaction:         5 min Manual Therapy: Palpation of pelvic floor contraction still 0/5   Rationale: Increase strength to improve patient's UI           min Patient Education:  Review HEP     Progressed/Changed HEP based on:    Add to precede contraction   positioning    body mechanics    transfers    heat/ice application        Other Objective/Functional Measures:    baseline resting tone: -   slow twitch mms 8.22(4) with add   fast twitch mms7.6(2.47) with add    Pain Level (0-10 scale)  post treatment: 0    ASSESSMENT/Changes in Function: Added accessory muscle use and PF NMES secondary to slow to strengthen.    Patient will continue to benefit from skilled PT services to modify and progress therapeutic interventions, address strength deficits, instruct in home and community integration and address urinary symptoms to attain remaining goals.       See Plan of Care    See progress note/recertification    See Discharge Summary         Progress towards goals / Updated goals:  STG #1 met    PLAN    Upgrade activities as tolerated       Continue plan of care    Update interventions per flow sheet         Discharge due to:_    Other:_      Larene Pickett, PT 01/10/2016  9:25 AM

## 2016-01-17 ENCOUNTER — Inpatient Hospital Stay: Admit: 2016-01-17 | Payer: Self-pay | Primary: Adult Health

## 2016-01-17 DIAGNOSIS — R102 Pelvic and perineal pain: Secondary | ICD-10-CM

## 2016-01-17 NOTE — Progress Notes (Signed)
PELVIC FLOOR DAILY TREATMENT NOTE 8-14    Patient Name: Jackie Hendricks  Date:01/17/2016  DOB: [birth year]    Patient DOB Verified  Payor: SELF PAY / Plan: [facility] SELF PAY / Product Type: Self Pay /    In time:9:23  Out time:10:09  Total Treatment Time (min): 46    Visit #: 4 of 8    Treatment Area: Pelvic and perineal pain [R10.2]    SUBJECTIVE  Pain Level (0-10 scale): 0  Any medication changes, allergies to medications, adverse drug reactions, diagnosis change, or new procedure performed?:  No     Yes (see summary sheet for update)  Subjective functional status/changes:    No changes reported  Patent reports doing HEP 3x day.  Having constipation.  BMs range from every other day to every 3 days.  No UI and decreased urinary frequency.  Pain resolved.     OBJECTIVE  Modality rationale: Neuromuscular reeducation to improve the patient???s urinary incontinence and increased urinary frequency, decrease perineal pain and pressure    Min Type Additional Details   26  Biofeedback x 26 minutes    supine surface   10  Estim: Att   Unatt        TENS instruct                  IFC  Premod   NMES  50 Hz 5/10                   Other:  w/US   w/ice   w/heat  Position: supine  Location: perianal      Traction:  Cervical       Lumbar                        Prone          Supine                       Intermittent   Continuous Lbs:   before manual   after manual      Ultrasound: Continuous    Pulsed                             Location:  W/cm2:      Iontophoresis with dexamethasone         Location:  Take home patch    In clinic      Ice       heat    Ice massage Position:  Location:      Vasopneumatic Device Pressure:        lo  med  hi   Temperature:  lo  med  hi    Skin assessment post-treatment:  intact redness- no adverse reaction       redness ??? adverse reaction:       2 min Manual Therapy: PF 1/5             8 min Patient Education:  Review HEP     Progressed/Changed HEP based on:  Increase slow twitch to 7 second holds.  Continue PF exercises with add.  Bladder diary and educated in urge suppression.    positioning    body mechanics    transfers    heat/ice application        Other Objective/Functional Measures:    baseline resting tone: -   slow twitch mms  9.56(3.879.68(5.62) with add   fast twitch mms11.2(5.08) with add    Pain Level (0-10 scale) post treatment: 0    ASSESSMENT/Changes in Function: Patient demonstrates improved strength of pelvic floor muscles with improved UI and decrease pain..      Patient will continue to benefit from skilled PT services to modify and progress therapeutic interventions, address strength deficits, instruct in home and community integration and address UI and urgency to attain remaining goals.       See Plan of Care    See progress note/recertification    See Discharge Summary         Progress towards goals / Updated goals:  See PN    PLAN    Upgrade activities as tolerated       Continue plan of care    Update interventions per flow sheet         Discharge due to:_    Other:_      Glenna FellowsMarlene M Deddrick Saindon, PT 01/17/2016  9:23 AM

## 2016-01-17 NOTE — Progress Notes (Signed)
[place] [facility] AT [address], [address], [place] [address] - Phone: [phone]  Fax: [phone]  PROGRESS NOTE  Patient Name: Jackie Hendricks DOB: Apr 11, 1981   Treatment/Medical Diagnosis: Pelvic and perineal pain [R10.2]   Referral Source: [place], MD     Date of Initial Visit: 12/13/2015 Attended Visits: 4 Missed Visits: 1   SUMMARY OF TREATMENT  PT has consisted of pelvic floor relaxation/strengthening via biofeedback, education as to  pelvic floor anatomy and function , NMES and home exercise program.   CURRENT STATUS  Patient has made good progress in PT with short term goals either met or progressing.  Functional progress  Includes patient reporting .  Patient demonstrates improved pelvic floor muscle strength.    Goal/Measure of Progress Goal Met?   1.  Patient performing pelvic floor exercises 3x day   Status at last Eval: na Current Status: 3x day yes   2.  Patient will report 25% improvement in pain with household chores and exercise..   Status at last Eval: na Current Status: 100% yes   3.  Increase PF strength to 1/5 to facilitate continence  4)Patient using urge suppression techniques to decrease urinary frequency   Status at last Eval: 0/5  na Current Status: 1/5  Began education Yes  progressing   New Goals to be achieved in __4__  weeks:  1.  Patient independent in home exercise program.   2.  Patient will report urinary frequency decreased to no > 9x daily    3.  Decrease score on FOTO/PFDI pain to 14 to indicate improved function and quality of life  4.  Increase PF muscle strength to 2/5 to facilitate normal urinary frequency   G-Codes: na  RECOMMENDATIONS  Continue pelvic floor PT 1x week for 4 weeks.   If you have any questions/comments please contact us directly at [phone].   Thank you for allowing [place] to assist in the care of your patient.    Therapist Signature: Larene Pickett, PT Date: 01/17/2016      Time: 7:32 AM   NOTE TO PHYSICIAN:  PLEASE COMPLETE THE ORDERS BELOW AND FAX TO   [facility]: [phone].  If you are unable to process this request in 24 hours please contact our office: [phone].    ___ I have read the above report and request that my patient continue as recommended.   ___ I have read the above report and request that my patient continue therapy with the following changes/special instructions:_________________________________________________________   ___ I have read the above report and request that my patient be discharged from therapy.     Physician Signature:        Date:       Time:

## 2016-01-24 ENCOUNTER — Inpatient Hospital Stay: Admit: 2016-01-24 | Payer: Self-pay | Primary: Adult Health

## 2016-01-24 NOTE — Progress Notes (Addendum)
PELVIC FLOOR DAILY TREATMENT NOTE 8-14    Patient Name: Jackie Hendricks  Date:01/24/2016  DOB: 28-Feb-1981    Patient DOB Verified  Payor: SELF PAY / Plan: [facility] SELF PAY / Product Type: Self Pay /    In time:9:22  Out time:10:10  Total Treatment Time (min): 38    Visit #: 5 of 8    Treatment Area: Pelvic and perineal pain [R10.2]    SUBJECTIVE  Pain Level (0-10 scale): 0  Any medication changes, allergies to medications, adverse drug reactions, diagnosis change, or new procedure performed?:  No     Yes (see summary sheet for update)  Subjective functional status/changes:    No changes reported  Patient reports doing bladder diary.   OBJECTIVE  Modality rationale: Neuromuscular reeducation to improve the patient???s urinary incontinence and increased urinary frequency, decrease perineal pain and pressure    Min Type Additional Details   15  Biofeedback x 15 minutes    supine and seated surface     Estim: Att   Unatt        TENS instruct                  IFC  Premod   NMES                     Other:  w/US   w/ice   w/heat  Position:   Location:       Traction:  Cervical       Lumbar                        Prone          Supine                       Intermittent   Continuous Lbs:   before manual   after manual      Ultrasound: Continuous    Pulsed                           1MHz   3MHz Location:  W/cm2:      Iontophoresis with dexamethasone         Location:  Take home patch    In clinic      Ice       heat    Ice massage Position:  Location:      Vasopneumatic Device Pressure:        lo  med  hi   Temperature:  lo  med  hi    Skin assessment post-treatment:  intact redness- no adverse reaction       redness ??? adverse reaction:     8 min Therapeutic Exercise:   See flow sheet :    Pelvic floor strengthening                  Pelvic floor downtraining    Quality pelvic floor contractions        Relaxation techniques    Urge suppression exercises    Other: core strengthening     Rationale: increase strength of accessory muscles to improve the patient???s urinary symptoms      15 min Bladder Training:  voiding schedule           program progression  decrease no > every 45 minutes, urge suppression            min Patient Education:  Review HEP     Progressed/Changed HEP based on: as above   positioning    body mechanics    transfers    heat/ice application        Other Objective/Functional Measures: NMES held secondary to electrode malfunction.   baseline resting tone: -   slow twitch mms 10.2(3.86)   fast twitch mms11.4(2.49)    Pain Level (0-10 scale) post treatment: 0    ASSESSMENT/Changes in Function: Increased urinary frequency as per bladder diary at 25x daily.    Patient will continue to benefit from skilled PT services to modify and progress therapeutic interventions, address strength deficits, instruct in home and community integration and address urinary symptoms to attain remaining goals.       See Plan of Care    See progress note/recertification    See Discharge Summary         Progress towards goals / Updated goals:  Patient to begin urge suppression and bladder retraining    PLAN    Upgrade activities as tolerated       Continue plan of care    Update interventions per flow sheet         Discharge due to:_    Other:_      Glenna Fellows, PT 01/24/2016  9:27 AM

## 2016-01-31 ENCOUNTER — Inpatient Hospital Stay: Admit: 2016-01-31 | Payer: Self-pay | Primary: Adult Health

## 2016-01-31 NOTE — Progress Notes (Signed)
PELVIC FLOOR DAILY TREATMENT NOTE 8-14    Patient Name: Jackie Hendricks  Date:01/31/2016  DOB: 06-Jan-1981    Patient DOB Verified  Payor: SELF PAY / Plan: [facility] SELF PAY / Product Type: Self Pay /    In time:9:18  Out time:10:12  Total Treatment Time (min): 54    Visit #: 6 of 8    Treatment Area: Pelvic and perineal pain [R10.2]    SUBJECTIVE  Pain Level (0-10 scale): 0  Any medication changes, allergies to medications, adverse drug reactions, diagnosis change, or new procedure performed?:  No     Yes (see summary sheet for update)  Subjective functional status/changes:    No changes reported  Patient reports being able to hold 30-45 minutes.    OBJECTIVE  Modality rationale: Neuromuscular reeducation to improve the patient???s urinary incontinence and increased urinary frequency, decrease perineal pain and pressure    Min Type Additional Details   19  Biofeedback x  minutes    supine and seated surface   10  Estim: Att   Unatt        TENS instruct                  IFC  Premod   NMES 50 Hz 5/10                    Other:  w/US   w/ice   w/heat  Position: supine  Location: perianal      Traction:  Cervical       Lumbar                        Prone          Supine                       Intermittent   Continuous Lbs:   before manual   after manual      Ultrasound: Continuous    Pulsed                           1MHz   3MHz Location:  W/cm2:      Iontophoresis with dexamethasone         Location:  Take home patch    In clinic      Ice       heat    Ice massage Position:  Location:      Vasopneumatic Device Pressure:        lo  med  hi   Temperature:  lo  med  hi    Skin assessment post-treatment:  intact redness- no adverse reaction       redness ??? adverse reaction:     15 min Therapeutic Exercise:   See flow sheet :    Pelvic floor strengthening                  Pelvic floor downtraining    Quality pelvic floor contractions        Relaxation techniques    Urge suppression exercises    Other: core strengthening     Rationale: increase strength of accessory muscles to improve the patient???s urinary symptoms  ??      10 min Bladder Training:  voiding schedule           program progression  decrease every 45 minutes            min Patient Education:  Review HEP     Progressed/Changed HEP based on: Add prone SLR and side leg circles   positioning    body mechanics    transfers    heat/ice application        Other Objective/Functional Measures:    baseline resting tone: -   slow twitch mms -   fast twitch mms3.74(1.84) without add, 10.7(4.01) with add    Pain Level (0-10 scale) post treatment: 0    ASSESSMENT/Changes in Function: Continues to need accessory muscle use to generate pelvic floor contraction.  Patient will continue to benefit from skilled PT services to modify and progress therapeutic interventions, address strength deficits, instruct in home and community integration and address urinary symptoms to attain remaining goals.  ????    See Plan of Care    See progress note/recertification    See Discharge Summary         Progress towards goals / Updated goals:  Slow progress towards all LTGs    PLAN    Upgrade activities as tolerated       Continue plan of care    Update interventions per flow sheet         Discharge due to:_    Other:_      Glenna Fellows, PT 01/31/2016  9:18 AM

## 2016-02-07 ENCOUNTER — Inpatient Hospital Stay: Admit: 2016-02-07 | Payer: Self-pay | Primary: Adult Health

## 2016-02-07 NOTE — Progress Notes (Signed)
PELVIC FLOOR DAILY TREATMENT NOTE 8-14    Patient Name: Jackie ButteChristina Hendricks  Date:02/07/2016  DOB: September 16, 1981    Patient DOB Verified  Payor: SELF PAY / Plan: [facility] SELF PAY / Product Type: Self Pay /    In time:9:10  Out time:10:18  Total Treatment Time (min): 65    Visit #: 7 of 8    Treatment Area: Pelvic and perineal pain [R10.2]    SUBJECTIVE  Pain Level (0-10 scale): 0  Any medication changes, allergies to medications, adverse drug reactions, diagnosis change, or new procedure performed?:  No     Yes (see summary sheet for update)  Subjective functional status/changes:    No changes reported  Patient reports no lower abdominal pain.    OBJECTIVE  Modality rationale: Neuromuscular reeducation to improve the patient???s urinary incontinence and increased urinary frequency, decrease perineal pain and pressure    Min Type Additional Details   30  Biofeedback x 30 minutes    sit and stand   10  Estim: Att   Unatt        TENS instruct                  IFC  Premod   NMES  50 Hz 5/10                   Other:  w/US   w/ice   w/heat  Position: supine  Location: vaginal      Traction:  Cervical       Lumbar                        Prone          Supine                       Intermittent   Continuous Lbs:   before manual   after manual      Ultrasound: Continuous    Pulsed                           1MHz   3MHz Location:  W/cm2:      Iontophoresis with dexamethasone         Location:  Take home patch    In clinic      Ice       heat    Ice massage Position:  Location:      Vasopneumatic Device Pressure:        lo  med  hi   Temperature:  lo  med  hi    Skin assessment post-treatment:  intact redness- no adverse reaction       redness ??? adverse reaction:     15 min Therapeutic Exercise:   See flow sheet :    Pelvic floor strengthening                  Pelvic floor downtraining    Quality pelvic floor contractions        Relaxation techniques    Urge suppression exercises    Other: core strengthening     Rationale: increase strength of accessory muscles to improve the patient???s urinary symptoms      10 min Bladder Training:  voiding schedule           program progression  decrease every 60 minutes            min Patient Education:  Review HEP     Progressed/Changed HEP based on:    positioning    body mechanics    transfers    heat/ice application        Other Objective/Functional Measures: Bladder diary shows frequency decreased from average 25x to average 19x.    baseline resting tone: -   slow twitch mms 8.11(3.1) seated with add   fast twitch mms12.6(3.69) standing without add    Pain Level (0-10 scale) post treatment: 0    ASSESSMENT/Changes in Function: Improved pelvic floor muscle strength without accessory muscle use with decreased urinary frequency.    Patient will continue to benefit from skilled PT services to modify and progress therapeutic interventions, address strength deficits, instruct in home and community integration and address urinary symptoms to attain remaining goals.  ????         See Plan of Care    See progress note/recertification    See Discharge Summary         Progress towards goals / Updated goals:  Slow progress towards all LTGs    PLAN    Upgrade activities as tolerated       Continue plan of care    Update interventions per flow sheet         Discharge due to:_    Other:_      Glenna Fellows, PT 02/07/2016  9:14 AM

## 2016-02-14 ENCOUNTER — Inpatient Hospital Stay: Admit: 2016-02-14 | Payer: Self-pay | Primary: Adult Health

## 2016-02-14 DIAGNOSIS — R102 Pelvic and perineal pain: Secondary | ICD-10-CM

## 2016-02-14 NOTE — Progress Notes (Signed)
PELVIC FLOOR DAILY TREATMENT NOTE 8-14    Patient Name: Jackie Hendricks  Date:02/14/2016  DOB: 25-Feb-1981    Patient DOB Verified  Payor: SELF PAY / Plan: [facility] SELF PAY / Product Type: Self Pay /    In time:8:25  Out time:9:32  Total Treatment Time (min): 67    Visit #: 8 of 8-12    Treatment Area: Pelvic and perineal pain [R10.2]    SUBJECTIVE  Pain Level (0-10 scale): 0  Any medication changes, allergies to medications, adverse drug reactions, diagnosis change, or new procedure performed?:  No     Yes (see summary sheet for update)  Subjective functional status/changes:    No changes reported  Patient reports nocturia decreased to 1x nightly.    OBJECTIVE  Modality rationale: Neuromuscular reeducation to improve the patient???s urinary incontinence and increased urinary frequency, decrease perineal pain and pressure    Min Type Additional Details   37  Biofeedback x 37 minutes    supine, sit and stand     Estim: Att   Unatt        TENS instruct                  IFC  Premod   NMES                     Other:  w/US   w/ice   w/heat  Position:  Location:      Traction:  Cervical       Lumbar                        Prone          Supine                       Intermittent   Continuous Lbs:   before manual   after manual      Ultrasound: Continuous    Pulsed                           1MHz   3MHz Location:  W/cm2:      Iontophoresis with dexamethasone         Location:  Take home patch    In clinic      Ice       heat    Ice massage Position:  Location:      Vasopneumatic Device Pressure:        lo  med  hi   Temperature:  lo  med  hi    Skin assessment post-treatment:  intact redness- no adverse reaction       redness ??? adverse reaction:     30 min Therapeutic Exercise:   See flow sheet :    Pelvic floor strengthening                  Pelvic floor downtraining    Quality pelvic floor contractions        Relaxation techniques    Urge suppression exercises    Other:  Core strengthening     Rationale: increase strength of accessory muscles to improve the patient???s urinary symptoms              min Patient Education:  Review HEP     Progressed/Changed HEP based on: Bladder diary 3 days.  Add cat/cow and 1/4 squat.   positioning  body mechanics    transfers    heat/ice application        Other Objective/Functional Measures: Bladder diary shows frequency decreased from average 25x to 12x.    baseline resting tone: -   slow twitch mms 7.96(4.07), 12.7(11.1) with add and bridge   fast twitch mms 8.58(4.55) with add    Pain Level (0-10 scale) post treatment: 0    ASSESSMENT/Changes in Function: Improved PF muscle strength with decreased nocturia.  Patient continues to benefit from accessory muscle use.    Patient will continue to benefit from skilled PT services to modify and progress therapeutic interventions, address strength deficits, instruct in home and community integration and address increased urinary frequency to attain remaining goals.       See Plan of Care    See progress note/recertification    See Discharge Summary         Progress towards goals / Updated goals:  Progressing towards all LTGs    PLAN    Upgrade activities as tolerated       Continue plan of care    Update interventions per flow sheet         Discharge due to:_    Other:_      Glenna Fellows, PT 02/14/2016  8:26 AM

## 2016-02-28 ENCOUNTER — Inpatient Hospital Stay: Admit: 2016-02-28 | Payer: Self-pay | Primary: Adult Health

## 2016-02-28 NOTE — Progress Notes (Signed)
PELVIC FLOOR DAILY TREATMENT NOTE 8-14    Patient Name: Jackie Hendricks  Date:02/28/2016  DOB: 08/24/1981    Patient DOB Verified  Payor: SELF PAY / Plan: [facility] SELF PAY / Product Type: Self Pay /    In time:9:24  Out time:10:10  Total Treatment Time (min): 46  Visit #: 9 of 12    Treatment Area: Pelvic and perineal pain [R10.2]    SUBJECTIVE  Pain Level (0-10 scale): 0  Any medication changes, allergies to medications, adverse drug reactions, diagnosis change, or new procedure performed?:  No     Yes (see summary sheet for update)  Subjective functional status/changes:    No changes reported  Patient reports rinary frequency decreased from 25x to 16 x.  Not having leaking.  Getting up on the average of 1x nightly.  Not having abdominal pain but does get LBP 3x weekly and with orgasm.      OBJECTIVE  Modality rationale: Neuromuscular reeducation to improve the patient???s increased urinary frequency.    Min Type Additional Details   26  Biofeedback x 24 minutes    supine, sit and stand surface   10  Estim: Att   Unatt        TENS instruct                  IFC  Premod   NMES 50Hz  5/10                    Other:  w/US   w/ice   w/heat  Position:supine  Location: perianal      Traction:  Cervical       Lumbar                        Prone          Supine                       Intermittent   Continuous Lbs:   before manual   after manual      Ultrasound: Continuous    Pulsed                           1MHz   3MHz Location:  W/cm2:      Iontophoresis with dexamethasone         Location:  Take home patch    In clinic      Ice       heat    Ice massage Position:  Location:      Vasopneumatic Device Pressure:        lo  med  hi   Temperature:  lo  med  hi    Skin assessment post-treatment:  intact redness- no adverse reaction       redness ??? adverse reaction:       10 min Bladder Training:  voiding schedule           program progression                                   decrease to no > every hour.  Bladder  diary, urge suppression            min Patient Education:  Review HEP     Progressed/Changed HEP based on: PF exercises supine with ball and  bridge.  Hold core strengthening.   positioning    body mechanics    transfers    heat/ice application        Other Objective/Functional Measures: PF contraction 1/5    baseline resting tone: -   slow twitch mms -   fast twitch mms15.9(7.39)    Pain Level (0-10 scale) post treatment: 0    ASSESSMENT/Changes in Function: Greatly improved symptoms but continued severe pelvic floor muscle weakness.  Benefits from accessory muscle use of hip add and ext.    Patient will continue to benefit from skilled PT services to modify and progress therapeutic interventions, address strength deficits, instruct in home and community integration and address increased urinary frequency to attain remaining goals.       See Plan of Care    See progress note/recertification    See Discharge Summary         Progress towards goals / Updated goals:  See PN    PLAN    Upgrade activities as tolerated       Continue plan of care    Update interventions per flow sheet         Discharge due to:_    Other:_      Glenna Fellows, PT 02/28/2016  9:25 AM

## 2016-02-28 NOTE — Progress Notes (Signed)
[facility]  [address] [place] [address] - Phone: [phone]  Fax: [phone]  PROGRESS NOTE  Patient Name: Jackie Hendricks DOB: Jan 26, 1981   Treatment/Medical Diagnosis: Pelvic and perineal pain [R10.2]   Referral Source: Golden Circle, MD     Date of Initial Visit: 12/13/2015 Attended Visits: 9 Missed Visits: 1   SUMMARY OF TREATMENT  PT has consisted of pelvic floor relaxation/strengthening via biofeedback, education as to  pelvic floor anatomy and function, NMES and home exercise program.   CURRENT STATUS  Patient has made good progress in PT with long term goals partially met.  Functional progress  Includes patient reporting  resolved lower abdominal pain/pressure and SUI.  Nocturia decreased to 1x nightly.   Urinary frequency decreased from an average of 25 to 16x daily.  Pelvic floor muscle strength improved but still severely impaired.    Goal/Measure of Progress Goal Met?   1.  Patient independent in HEP   Status at last Eval: progressing Current Status: progressing progressing   2.  Patient will report urinary frequency decreased to no > 9x daily    Status at last Eval: 25 Current Status: 16 yes   3.  Decrease score on FOTO/PFDI pain to 14 to indicate improved function and quality of life  4. Increase PF muscle strength to 2/5 to facilitate normal urinary frequency   Status at last Eval: 21  0/5 Current Status: 0  1/5 Yes  progressing   New Goals to be achieved in __4__  weeks:  1.  Patient independent in home exercise program.   2.  Patient will  decrease urinary frequency to no > 12x daily.   3.  Increase PF muscle strength to 2/5 to facilitate normal urinary frequency.   G-Codes: na  RECOMMENDATIONS  Continue pelvic floor PT 1x week for 4 weeks to further increase PF muscle strength and decrease urinary frequency..   If you have any questions/comments please contact us directly at [phone].    Thank you for allowing [place] to assist in the care of your patient.    Therapist Signature: Larene Pickett, PT Date: 02/28/2016     Time: 7:33 AM   NOTE TO PHYSICIAN:  PLEASE COMPLETE THE ORDERS BELOW AND FAX TO   [facility] at [facility]: [phone].  If you are unable to process this request in 24 hours please contact our office: [phone].    ___ I have read the above report and request that my patient continue as recommended.   ___ I have read the above report and request that my patient continue therapy with the following changes/special instructions:_________________________________________________________   ___ I have read the above report and request that my patient be discharged from therapy.     Physician Signature:        Date:       Time:

## 2016-03-06 ENCOUNTER — Inpatient Hospital Stay: Admit: 2016-03-06 | Payer: Self-pay | Primary: Adult Health

## 2016-03-06 NOTE — Progress Notes (Signed)
PELVIC FLOOR DAILY TREATMENT NOTE 8-14    Patient Name: Jackie Hendricks  Date:03/06/2016  DOB: [birth year]    Patient DOB Verified  Payor: SELF PAY / Plan: [facility] SELF PAY / Product Type: Self Pay /    In time:9:29  Out time:10:06  Total Treatment Time (min): 37    Visit #: 10 of 13    Treatment Area: Pelvic and perineal pain [R10.2]    SUBJECTIVE  Pain Level (0-10 scale): 0  Any medication changes, allergies to medications, adverse drug reactions, diagnosis change, or new procedure performed?:  No     Yes (see summary sheet for update)  Subjective functional status/changes:    No changes reported  Patient reports that she has been using urge suppression techniques    OBJECTIVE  Modality rationale: Neuromuscular reeducation to improve the patient???s increased urinary frequency   Min Type Additional Details   27  Biofeedback x 27 minutes    supine with add/bridge   10  Estim: Att   Unatt        TENS instruct                  IFC  Premod   NMES 50Hz  5/10                    Other:  w/US   w/ice   w/heat  Position:supine  Location: perianal      Traction:  Cervical       Lumbar                        Prone          Supine                       Intermittent   Continuous Lbs:   before manual   after manual      Ultrasound: Continuous    Pulsed                           1MHz   3MHz Location:  W/cm2:      Iontophoresis with dexamethasone         Location:  Take home patch    In clinic      Ice       heat    Ice massage Position:  Location:      Vasopneumatic Device Pressure:        lo  med  hi   Temperature:  lo  med  hi    Skin assessment post-treatment:  intact redness- no adverse reaction       redness ??? adverse reaction:              min Patient Education:  Review HEP     Progressed/Changed HEP based on:  Continue HEP/Bladder diary   positioning    body mechanics    transfers    heat/ice application        Other Objective/Functional Measures: Average frequency  11x daily as per diaries    baseline resting tone: -    slow twitch mms 16.5(8.8) with bridge/add.  5.58(1.77) without accessory   fast twitch mms12.7(5.39) with bridge/add.  8.91(1.16)    Pain Level (0-10 scale) post treatment: 0    ASSESSMENT/Changes in Function: Decreasing frequency of urination.  Patient still benefits from accessory muscle use to generate pelvic floor contraction    Patient  will continue to benefit from skilled PT services to modify and progress therapeutic interventions, address strength deficits, instruct in home and community integration and address increased urinary frequency to attain remaining goals.       See Plan of Care    See progress note/recertification    See Discharge Summary         Progress towards goals / Updated goals:    1.  Patient independent in home exercise program.progressing   2.  Patient will decrease urinary frequency to no > 12x daily. met   3.  Increase PF muscle strength to 2/5 to facilitate normal urinary frequency         PLAN    Upgrade activities as tolerated       Continue plan of care    Update interventions per flow sheet         Discharge due to:_    Other:_      Larene Pickett, PT 03/06/2016  9:32 AM

## 2016-03-13 ENCOUNTER — Inpatient Hospital Stay: Admit: 2016-03-13 | Payer: Self-pay | Primary: Adult Health

## 2016-03-13 NOTE — Progress Notes (Signed)
PELVIC FLOOR DAILY TREATMENT NOTE 8-14    Patient Name: Jackie Hendricks  Date:03/13/2016  DOB: [birth year]    Patient DOB Verified  Payor: SELF PAY / Plan: [facility] SELF PAY / Product Type: Self Pay /    In time:9:18  Out time:10:04  Total Treatment Time (min): 46    Visit #: 11 of 13    Treatment Area: Pelvic and perineal pain [R10.2]    SUBJECTIVE  Pain Level (0-10 scale): 0  Any medication changes, allergies to medications, adverse drug reactions, diagnosis change, or new procedure performed?:  No     Yes (see summary sheet for update)  Subjective functional status/changes:    No changes reported  Patient reports doing HEP 3 x day.    OBJECTIVE  Modality rationale: Neuromuscular reeducation to improve the patient???s increased urinary frequency   Min Type Additional Details   26  Biofeedback x 26 minutes    seated surface   10  Estim: Att   Unatt        TENS instruct                  IFC  Premod   NMES  50 Hz 5/10                   Other:  w/US   w/ice   w/heat  Position: supine  Location: perianal      Traction:  Cervical       Lumbar                        Prone          Supine                       Intermittent   Continuous Lbs:   before manual   after manual      Ultrasound: Continuous    Pulsed                           1MHz   3MHz Location:  W/cm2:      Iontophoresis with dexamethasone         Location:  Take home patch    In clinic      Ice       heat    Ice massage Position:  Location:      Vasopneumatic Device Pressure:        lo  med  hi   Temperature:  lo  med  hi    Skin assessment post-treatment:  intact redness- no adverse reaction       redness ??? adverse reaction:     10 min Bladder Training:  voiding schedule           program progression                                   decrease every 2 hours.  Urge suppression part 2.            min Patient Education:  Review HEP     Progressed/Changed HEP based on: as above   positioning    body mechanics    transfers    heat/ice application         Other Objective/Functional Measures: Average urinary frequency as per bladder diary 10x    baseline resting  tone: -   slow twitch mms 6.03(3.42)   fast twitch mms7.79(4.1)    Pain Level (0-10 scale) post treatment: 0    ASSESSMENT/Changes in Function: Patient continues to be slow to strengthen but has not been leaking and urinary frequency is decreasing.  Patient will continue to benefit from skilled PT services to modify and progress therapeutic interventions, address strength deficits, instruct in home and community integration and address increased urinary frequency to attain remaining goals.  ????    See Plan of Care    See progress note/recertification    See Discharge Summary         Progress towards goals / Updated goals:  ??  1.  Patient independent in home exercise program.progressing   2.  Patient will decrease urinary frequency to no > 12x daily. met   3.  Increase PF muscle strength to 2/5 to facilitate normal urinary frequency   ??    PLAN    Upgrade activities as tolerated       Continue plan of care    Update interventions per flow sheet         Discharge due to:_    Other:_      Larene Pickett, PT 03/13/2016  9:19 AM

## 2016-03-20 ENCOUNTER — Inpatient Hospital Stay: Admit: 2016-03-20 | Payer: Self-pay | Primary: Adult Health

## 2016-03-20 DIAGNOSIS — R102 Pelvic and perineal pain: Secondary | ICD-10-CM

## 2016-03-20 NOTE — Progress Notes (Addendum)
PELVIC FLOOR DAILY TREATMENT NOTE 8-14    Patient Name: Jackie Hendricks  Date:03/20/2016  DOB: [birth year]    Patient DOB Verified  Payor: SELF PAY / Plan: [facility] SELF PAY / Product Type: Self Pay /    In time:9:26  Out time:10:11  Total Treatment Time (min): 45    Visit #: 12 of 13    Treatment Area: Pelvic and perineal pain [R10.2]    SUBJECTIVE  Pain Level (0-10 scale): 0  Any medication changes, allergies to medications, adverse drug reactions, diagnosis change, or new procedure performed?:  No     Yes (see summary sheet for update)  Subjective functional status/changes:    No changes reported  Urinary frequency 7x average this week.  No leaking.  Getting up 1x night on average. 95% improved overall. Would like to make this her last visit.  OBJECTIVE  Modality rationale: Neuromuscular reeducation to improve the patient???s increased urinary frequency   Min Type Additional Details   25  Biofeedback x 25 minutes    seated and standing surface   10  Estim: Att   Unatt        TENS instruct                  IFC  Premod   NMES  50 Hz 5/10                   Other:  w/US   w/ice   w/heat  Position: supine  Location: perianaal      Traction:  Cervical       Lumbar                        Prone          Supine                       Intermittent   Continuous Lbs:   before manual   after manual      Ultrasound: Continuous    Pulsed                           1MHz   3MHz Location:  W/cm2:      Iontophoresis with dexamethasone         Location:  Take home patch    In clinic      Ice       heat    Ice massage Position:  Location:      Vasopneumatic Device Pressure:        lo  med  hi   Temperature:  lo  med  hi    Skin assessment post-treatment:  intact redness- no adverse reaction       redness ??? adverse reaction:             10 min Patient Education:  Review HEP     Progressed/Changed HEP based on:  D/C instructions   positioning    body mechanics    transfers    heat/ice application         Other Objective/Functional Measures: PF 1/5.    baseline resting tone: -   slow twitch mms 9.2(3.27)   fast twitch mms10.4(3.26)    Pain Level (0-10 scale) post treatment: 0    ASSESSMENT/Changes in Function: Resolved pain and urinary symptoms but continued severely impaired pelvic floor muscle strength.  Patient to continue on HEP to  address this. Not recommending NMES at this time secondary to patient symptoms resolved and patient declines use of vaginal sensor.    Patient will continue to benefit from skilled PT services to na to attain remaining goals.       See Plan of Care    See progress note/recertification    See Discharge Summary         Progress towards goals / Updated goals:  See D/c note    PLAN    Upgrade activities as tolerated       Continue plan of care    Update interventions per flow sheet         Discharge due SH:[id number] completed with goals either met or progressing._    Other:_      Larene Pickett, PT 03/20/2016  9:23 AM

## 2016-03-20 NOTE — Progress Notes (Signed)
[facility]  [address] [place], [place] [address]   Phone: [phone]  Fax: [phone]  DISCHARGE SUMMARY  Patient Name: Jackie Hendricks DOB: 05-Jan-1981   Treatment/Medical Diagnosis: Pelvic and perineal pain [R10.2]   Referral Source: [facility], MD     Date of Initial Visit: 12/13/2015 Attended Visits: 12 Missed Visits: 1     SUMMARY OF TREATMENT  PT has consisted of pelvic floor relaxation/strengthening via biofeedback, education as to pelvic floor anatomy and function, NMES and home exercise program.     CURRENT STATUS  Patient has made good progress in PT with long term goals either met or progressing.  Functional progress  Includes patient reporting 95% improvement in urinary symptoms and resolved lower abdominal pain and pressure.  However she continues to have severe pelvic floor muscle weakness.  Patient to continue on home program to address this.  [place] Presume of Progress Goal Met?   1.  Patient independent in HEP.   Status at last Eval: progressing Current Status: Pt independent and compliant yes   2.  Patient will decrease urinary frequency to no > 12x daily.   Status at last Eval: 25 at initial  16 at midpoint Current Status: 7x yes   3.  Increase PF muscle strength to 2/5 to facilitate normal urinary frequency   Status at last Eval: 0/5 at initial  1/5 at midpoint Current Status: 1+/5 Progressing.     RECOMMENDATIONS  Discontinue therapy. Progressing towards or have reached established goals..  Patient to continue on home exercise program.  If you have any questions/comments please contact us directly at [phone].   Thank you for allowing [place] to assist in the care of your patient.    Therapist Signature: Larene Pickett, PT Date: 03/20/2016     Time: 10:19 AM
